# Patient Record
Sex: Female | Born: 1975 | Hispanic: Yes | Marital: Married | Smoking: Never smoker
Health system: Southern US, Community
[De-identification: ages and names within clinical notes are randomized; demographics above are authoritative.]

## PROBLEM LIST (undated history)

## (undated) DIAGNOSIS — I10 Essential (primary) hypertension: Secondary | ICD-10-CM

## (undated) DIAGNOSIS — E119 Type 2 diabetes mellitus without complications: Secondary | ICD-10-CM

---

## 2005-06-02 ENCOUNTER — Emergency Department: Payer: Self-pay | Admitting: Emergency Medicine

## 2005-06-03 ENCOUNTER — Ambulatory Visit: Payer: Self-pay | Admitting: Emergency Medicine

## 2006-10-27 ENCOUNTER — Ambulatory Visit: Payer: Self-pay

## 2007-01-26 ENCOUNTER — Ambulatory Visit: Payer: Self-pay | Admitting: Family Medicine

## 2007-01-27 ENCOUNTER — Ambulatory Visit: Payer: Self-pay | Admitting: Family Medicine

## 2007-02-27 ENCOUNTER — Ambulatory Visit: Payer: Self-pay | Admitting: Family Medicine

## 2007-03-29 ENCOUNTER — Ambulatory Visit: Payer: Self-pay | Admitting: Family Medicine

## 2007-04-29 ENCOUNTER — Ambulatory Visit: Payer: Self-pay | Admitting: Family Medicine

## 2007-05-30 ENCOUNTER — Ambulatory Visit: Payer: Self-pay | Admitting: Family Medicine

## 2017-12-03 ENCOUNTER — Emergency Department: Payer: Self-pay

## 2017-12-03 ENCOUNTER — Other Ambulatory Visit: Payer: Self-pay

## 2017-12-03 ENCOUNTER — Emergency Department
Admission: EM | Admit: 2017-12-03 | Discharge: 2017-12-03 | Disposition: A | Discharge status: Home / Self Care | Payer: Self-pay | Attending: Emergency Medicine | Admitting: Emergency Medicine

## 2017-12-03 DIAGNOSIS — Y9389 Activity, other specified: Secondary | ICD-10-CM | POA: Insufficient documentation

## 2017-12-03 DIAGNOSIS — I10 Essential (primary) hypertension: Secondary | ICD-10-CM | POA: Insufficient documentation

## 2017-12-03 DIAGNOSIS — E119 Type 2 diabetes mellitus without complications: Secondary | ICD-10-CM | POA: Insufficient documentation

## 2017-12-03 DIAGNOSIS — Y998 Other external cause status: Secondary | ICD-10-CM | POA: Insufficient documentation

## 2017-12-03 DIAGNOSIS — Y92410 Unspecified street and highway as the place of occurrence of the external cause: Secondary | ICD-10-CM | POA: Insufficient documentation

## 2017-12-03 DIAGNOSIS — M549 Dorsalgia, unspecified: Secondary | ICD-10-CM | POA: Insufficient documentation

## 2017-12-03 DIAGNOSIS — R51 Headache: Secondary | ICD-10-CM | POA: Insufficient documentation

## 2017-12-03 DIAGNOSIS — M25512 Pain in left shoulder: Secondary | ICD-10-CM | POA: Insufficient documentation

## 2017-12-03 DIAGNOSIS — S161XXA Strain of muscle, fascia and tendon at neck level, initial encounter: Secondary | ICD-10-CM | POA: Insufficient documentation

## 2017-12-03 HISTORY — DX: Essential (primary) hypertension: I10

## 2017-12-03 HISTORY — DX: Type 2 diabetes mellitus without complications: E11.9

## 2017-12-03 LAB — POCT PREGNANCY, URINE: PREG TEST UR: NEGATIVE

## 2017-12-03 MED ORDER — ACETAMINOPHEN 500 MG PO TABS
1000.0000 mg | ORAL_TABLET | Freq: Once | ORAL | Status: AC
  Administered 2017-12-03: 1000 mg via ORAL
  Filled 2017-12-03: qty 2

## 2017-12-03 MED ORDER — DIAZEPAM 5 MG PO TABS: 5.0000 mg | ORAL_TABLET | Freq: Three times a day (TID) | ORAL | 0 refills | Status: AC | PRN

## 2017-12-03 MED ORDER — IBUPROFEN 800 MG PO TABS: 800.0000 mg | ORAL_TABLET | Freq: Three times a day (TID) | ORAL | 0 refills | Status: AC | PRN

## 2017-12-03 NOTE — ED Notes (Signed)
POC urine pregnancy test NEGATIVE 

## 2017-12-03 NOTE — ED Notes (Signed)
Discharge instructions given with MD and interpreter at bedside . PT verbalizes d/c understanding, RX and follo wup
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
